# Patient Record
Sex: Male | Born: 1990
Health system: Southern US, Community
[De-identification: ages and names within clinical notes are randomized; demographics above are authoritative.]

## PROBLEM LIST (undated history)

## (undated) DIAGNOSIS — J302 Other seasonal allergic rhinitis: Secondary | ICD-10-CM

## (undated) HISTORY — DX: Other seasonal allergic rhinitis: J30.2

---

## 2009-04-30 ENCOUNTER — Ambulatory Visit: Payer: Self-pay | Admitting: Sports Medicine

## 2009-05-20 ENCOUNTER — Ambulatory Visit: Payer: Self-pay | Admitting: Orthopedic Surgery

## 2009-05-27 ENCOUNTER — Ambulatory Visit: Payer: Self-pay | Admitting: Orthopedic Surgery

## 2009-11-05 ENCOUNTER — Encounter: Payer: Self-pay | Admitting: Family Medicine

## 2010-05-31 ENCOUNTER — Ambulatory Visit: Payer: Self-pay | Admitting: Family Medicine

## 2010-09-04 ENCOUNTER — Ambulatory Visit: Payer: Self-pay | Admitting: Family Medicine

## 2015-11-23 ENCOUNTER — Telehealth (INDEPENDENT_AMBULATORY_CARE_PROVIDER_SITE_OTHER): Payer: Self-pay | Admitting: Internal Medicine

## 2015-11-23 NOTE — Telephone Encounter (Signed)
Called to reschedule appt for 11/25/15 due to the physician not being in the office in the morning

## 2015-11-25 ENCOUNTER — Ambulatory Visit (INDEPENDENT_AMBULATORY_CARE_PROVIDER_SITE_OTHER): Payer: Commercial Managed Care - POS | Admitting: Internal Medicine

## 2015-12-02 ENCOUNTER — Encounter (INDEPENDENT_AMBULATORY_CARE_PROVIDER_SITE_OTHER): Payer: Self-pay | Admitting: Internal Medicine

## 2015-12-02 ENCOUNTER — Ambulatory Visit (FREE_STANDING_LABORATORY_FACILITY): Payer: Commercial Managed Care - POS | Admitting: Internal Medicine

## 2015-12-02 VITALS — BP 135/83 | HR 74 | Temp 98.9°F | Resp 12 | Ht 67.5 in | Wt 208.0 lb

## 2015-12-02 DIAGNOSIS — L299 Pruritus, unspecified: Secondary | ICD-10-CM

## 2015-12-02 DIAGNOSIS — Z833 Family history of diabetes mellitus: Secondary | ICD-10-CM | POA: Insufficient documentation

## 2015-12-02 DIAGNOSIS — E669 Obesity, unspecified: Secondary | ICD-10-CM | POA: Insufficient documentation

## 2015-12-02 DIAGNOSIS — Z0000 Encounter for general adult medical examination without abnormal findings: Secondary | ICD-10-CM

## 2015-12-02 LAB — CBC
Absolute NRBC: 0 10*3/uL
Hematocrit: 44.2 % (ref 42.0–52.0)
Hgb: 14.2 g/dL (ref 13.0–17.0)
MCH: 27 pg — ABNORMAL LOW (ref 28.0–32.0)
MCHC: 32.1 g/dL (ref 32.0–36.0)
MCV: 84.2 fL (ref 80.0–100.0)
MPV: 10 fL (ref 9.4–12.3)
Nucleated RBC: 0 /100 WBC (ref 0.0–1.0)
Platelets: 296 10*3/uL (ref 140–400)
RBC: 5.25 10*6/uL (ref 4.70–6.00)
RDW: 13 % (ref 12–15)
WBC: 6.25 10*3/uL (ref 3.50–10.80)

## 2015-12-02 LAB — URINALYSIS REFLEX TO MICROSCOPIC EXAM - REFLEX TO CULTURE
Bilirubin, UA: NEGATIVE
Glucose, UA: NEGATIVE
Ketones UA: NEGATIVE
Leukocyte Esterase, UA: NEGATIVE
Nitrite, UA: NEGATIVE
Protein, UR: NEGATIVE
Specific Gravity UA: 1.03 (ref 1.001–1.035)
Urine pH: 5.5 (ref 5.0–8.0)
Urobilinogen, UA: 0.2

## 2015-12-02 LAB — GFR: EGFR: >60

## 2015-12-02 LAB — LIPID PANEL
Cholesterol / HDL Ratio: 5.2
Cholesterol: 239 mg/dL — ABNORMAL HIGH (ref 0–199)
HDL: 46 mg/dL (ref 40–9999)
LDL Calculated: 167 mg/dL — ABNORMAL HIGH (ref 0–99)
Triglycerides: 129 mg/dL (ref 34–149)
VLDL Calculated: 26 mg/dL (ref 10–40)

## 2015-12-02 LAB — BASIC METABOLIC PANEL
BUN: 16 mg/dL (ref 9.0–28.0)
CO2: 24 mEq/L (ref 21–30)
Calcium: 9.8 mg/dL (ref 8.5–10.5)
Chloride: 102 mEq/L (ref 100–111)
Creatinine: 1.1 mg/dL (ref 0.5–1.5)
Glucose: 80 mg/dL (ref 70–100)
Potassium: 4.4 mEq/L (ref 3.5–5.1)
Sodium: 137 mEq/L (ref 135–146)

## 2015-12-02 LAB — HEMOLYSIS INDEX: Hemolysis Index: 6 (ref 0–18)

## 2015-12-02 LAB — HEMOGLOBIN A1C
Average Estimated Glucose: 108.3 mg/dL
Hemoglobin A1C: 5.4 % (ref 4.6–5.9)

## 2015-12-02 LAB — HEPATIC FUNCTION PANEL
ALT: 37 U/L (ref 0–55)
AST (SGOT): 75 U/L — ABNORMAL HIGH (ref 5–34)
Albumin/Globulin Ratio: 1.1 (ref 0.9–2.2)
Albumin: 4.2 g/dL (ref 3.5–5.0)
Alkaline Phosphatase: 53 U/L (ref 38–106)
Bilirubin Direct: 0.3 mg/dL (ref 0.0–0.5)
Bilirubin Indirect: 0.5 mg/dL (ref 0.0–1.0)
Bilirubin, Total: 0.8 mg/dL (ref 0.1–1.2)
Globulin: 3.9 g/dL — ABNORMAL HIGH (ref 2.0–3.7)
Protein, Total: 8.1 g/dL (ref 6.0–8.3)

## 2015-12-02 MED ORDER — SELENIUM SULFIDE 2.5 % EX LOTN
TOPICAL_LOTION | CUTANEOUS | 0 refills | Status: AC
Start: 2015-12-02 — End: ?

## 2015-12-02 NOTE — Patient Instructions (Addendum)
If you have not done so already, please sign up for Mychart (instructions should be on the bottom of your visit summary).    See me once yearly for annual preventative visit.  This is different and separate from any problem-related visit.      Get a flu vaccine yearly in the fall.  Increase vegetables, fruits, and fiber in your diet.  Exercise at least 30 minutes 5 days per week.  Always wear your seatbelt in the car.  Wear sunscreen that is broad-spectrum (UVA/UVB) and at least SPF 30.  Avoid heavy drinking of alcohol (> 2 drinks daily or >14 drinks per week).  Goal Blood Pressure <140/90  Schedule dental exam and cleaning every 6 months  Have your vision checked every 1-2 years.  Do you have an advanced directive? If so please provide our office a copy. I recommend that all adults regardless of age have an advanced directive.   Medication refills should generally be obtained as part of a routine office visit and not via phone request or MyChart message. Generally if you find yourself running low on a medication, this is a signal that it is time to schedule an appointment to see me.     The best website for medical information is called UpToDate.  It's free for patients.  [email or web address]    The [facility] website also has good information.  [email or web address].[email or web address]    DIETARY RECOMMENDATIONS    Try to limit simple carbohydrates and sugars - these are the enemy if you are looking to lose weight.  Examples are white bread, white rice, potatoes, regular pasta, soda, chips/crackers, cookies, cakes, candy etc.  These foods cause spikes in blood sugar which can lead to feeling hungrier and can contribute to weight gain. Try to replace these with complex carbohydrates: 100% whole wheat bread/pasta, brown rice, oatmeal, and legumes (beans, lentils, etc). Try to avoid soda, diet soda, fruit juice and other sugary drinks entirely. These have been linked to an increased risk of diabetes. Drink water!     Eat at least 5  servings of vegetables and fruit daily.  As a general rule, the more fiber the better. Fiber helps you feel full so you eat less. If you still feel hungry after a meal, try drinking some water and waiting 20 minutes - you may find the urge to eat more will pass. The hormone signal that tells your brain that your stomach is full takes 20 minutes - so if you continue to eat until you feel full you have overeaten.    Try to limit the amount of saturated fat and trans fat in your diet.  These are in foods like red meat, vegetable oil, butter, dairy products that are not low-fat (such as most cheese, 2% or whole milk, cream, etc), fried foods, and rich sweets like cakes/donuts/etc. There are some fats that are good for you - these are unsaturated fats (especially monounsaturated).  These are found in olive oil, canola oil, nuts, seeds, and avocados.  Try to replace saturated and trans fats with these kinds of foods.    Try to limit the amount of sodium in your diet to less than 2000mg per day.  Most people do not get the majority of their sodium from table salt.  The sodium comes from processed foods.  Anything canned, frozen, pre-packaged, etc usually has a large amount of sodium, so read the labels.  Deli meat is another common source.      Eating   out is the most common source of dietary sodium.  Almost all restaurant meals have extremely high sodium (1000-3000mg plus!), even in most cases the salads.  Minimize eating out, and ask if they have any low sodium options.  But generally, fresh cooking at home is the healthiest.

## 2015-12-02 NOTE — Progress Notes (Signed)
Chief Complaint   Patient presents with   . Annual Exam   . itchy scalp       History of Present Illness    Eric Moss is a 25 y.o. male who I am seeing for the first time who is here for evaluation and treatment of the following concerns: He is here for an annual exam. He reports being up-to-date on his tetanus shot.  He declines a flu shot.  He exercises 3-4 times per week with weight lifting and running.  He does not abuse tobacco or alcohol.  He reports a diet that may be heavy at times and carbohydrates.  He has no chronic medical problems.  He has had several surgeries for anterior cruciate ligament repair and shoulder.  He is fasting for blood work today.  He also notes she has had a somewhat chronic difficulty with an itchy scalp.  He washes his hair to 3 times per week with shampoo and conditioner.  He denies any excessive dandruff, but does have some persistent itching of his scalp.  He denies any other history of significant skin conditions.  He has changed shampoos without improvement.  He has a family history of diabetes    Patient Active Problem List    Diagnosis Date Noted   . Obesity (BMI 30-39.9) 12/02/2015   . Family history of diabetes mellitus in father 12/02/2015     History reviewed. No pertinent past medical history.  Past Surgical History:   Procedure Laterality Date   . ARTHROSCOPIC REPAIR ACL Left 2016   . ARTHROSCOPIC REPAIR ACL Right 2010   . SHOULDER SURGERY Left 2011    labrum       There is no immunization history on file for this patient.  Health Maintenance   Topic Date Due   . INFLUENZA VACCINE  10/02/2015   . DEPRESSION SCREENING  12/01/2016   :  Current Outpatient Prescriptions   Medication Sig Dispense Refill   . selenium sulfide (SELSUN) 2.5 % shampoo Massage ~5 -10 mL into wet scalp; leave on for 2-3 min, then rinse thoroughly. Usually 2 applications each week for 2 weeks 118 mL 0     No current facility-administered medications for this visit.      No Known  Allergies  Social History     Social History   . Marital status: Single     Spouse name: N/A   . Number of children: N/A   . Years of education: N/A     Occupational History   . Not on file.     Social History Main Topics   . Smoking status: Never Smoker   . Smokeless tobacco: Never Used   . Alcohol use Yes      Comment: 2-3 week   . Drug use: No   . Sexual activity: Yes     Partners: Female     Other Topics Concern   . Not on file     Social History Narrative    He lives in [place]. From [place] originally. Single. Works in Consulting civil engineer.     Family History   Problem Relation Age of Onset   . Diabetes Father    . Hypertension Father    . No known problems Mother    . No known problems Brother        Review of Systems  CONST: 10 lb WG this year no fevers/chills or sweats, fatigue, muscle aches  EYES: No blurry vision, double vision, loss of  peripheral vision  EARS: No hearing loss, tinnitus, pain or discharge.   NOSE: No congestion, runny nose or bloody nose  MOUTH: No sore throat, oral lesions, difficulty swallowing  NECK: No swollen glands, stiffness or pain  CV: No chest pain, palpitations, leg swelling  RESP: No shortness of breath, cough, wheeze, asthma  GI: No n/v, diarrhea, constipation, abd pain, heartburn, blood in stool  MALE GU: No dysuria, frequency, hematuria, nocturia, testicular changes, penile discharge, erectile dysfunction  MSK:  No joint or muscle pain, swelling or weakness  SKIN:   No other rashes lumps, sores or concerning moles  ENDO:  No heat/cold intolerance, polyuria, polydipsia, polyphagia  HEME:  No bruising/bleeding, swollen glands  NEURO:  No headache, lightneadedness, dizziness, loss of consciousness, numbness/tingling, weakness  PSYCH:  No depressed mood, anhedonia, anxiety    Physical Exam:  BP 135/83 (BP Site: Left arm, Patient Position: Sitting, Cuff Size: Medium)   Pulse 74   Temp 98.9 F (37.2 C) (Oral)   Resp 12   Ht 1.715 m (5' 7.5")   Wt 94.3 kg (208 lb)   SpO2 99%   BMI 32.10 kg/m     Wt Readings from Last 3 Encounters:   12/02/15 94.3 kg (208 lb)     GEN: muscular male alert and appropriate in no apparent distress  HENT: tympanic membrane normal bilaterally, no nasal congestion, oropharynx normal w no exudate or erythema  EYES: PERRL, EOMI, no pallor or scleral icterus, normal conjunctiva  NECK: supple, no thyromegaly or nodules appreciated  LYMPH: no cervical or supraclavicular lymphadenopathy appreciated  CV: RRR, no m/r/g.  No LE edema.  2+ radial pulses present and equal.  RESP: CTAB, normal effort, no rhonchi or rales  GI: soft, nontender/ nondistended, NABS, no rebound or guarding, no evident hepatosplenomegaly  SKIN: warm, dry.  no visible rash of scalp   MSK: Normal bulk and tone  NEURO: PERRLA, EOMI, face symmetric, hearing intact to voice, palate raise, shoulder shrug and neck flexion intact, tongue midline. Moves all extremities.  PSYCH: normal mood and affect    Assessment/Plan:    1. Annual physical exam  Anticipatory guidance given. Recommended routine blood work as ordered below.     - Basic Metabolic Panel  - Hepatic function panel (LFT)  - Lipid panel  - CBC without differential  - UA/Micro reflex Culture Routine    2. Obesity (BMI 30-39.9)  I counseled him that based on his body mass index (BMI) that he is considered obese. . At this time I counseled him on dietary changes he could make to try to lose weight and provided these in written form in his discharge paperwork. I recommended dietary changes, exercise 30 minutes or more at least 5 days of the week, avoiding sedentary behavior while at home or at work.     HIgh BMI Follow Up   BMI Follow Up Care Plan Documented    Nutrition Counseling:   Low carbohydrate diet education   Weight-reducing diet education    Physical Activity Counseling   Patient advised about exercise    - Hemoglobin A1C    3. Itchy scalp  Trial of selenium - if not improved recommend derm evaluation  - selenium sulfide (SELSUN) 2.5 % shampoo; Massage ~5  -10 mL into wet scalp; leave on for 2-3 min, then rinse thoroughly. Usually 2 applications each week for 2 weeks  Dispense: 118 mL; Refill: 0    4. Family history of diabetes mellitus in father  Check A1C above    Orders Placed This Encounter   Procedures   . Basic Metabolic Panel   . Hepatic function panel (LFT)   . Lipid panel   . CBC without differential   . UA/Micro reflex Culture Routine   . Hemoglobin A1C   . Hemolysis index   . GFR     Risks & benefits of the new medication(s) were explained to the patient, who appeared to understand and agrees to the treatment plan.    Return in about 1 year (around 12/01/2016).    Gloriajean Dell, MD  [facility] [place]  [address]  [place], [place] [address]  [phone]  F: [phone]

## 2015-12-03 ENCOUNTER — Encounter (INDEPENDENT_AMBULATORY_CARE_PROVIDER_SITE_OTHER): Payer: Self-pay | Admitting: Internal Medicine

## 2015-12-03 DIAGNOSIS — R7401 Elevation of levels of liver transaminase levels: Secondary | ICD-10-CM | POA: Insufficient documentation

## 2015-12-03 DIAGNOSIS — E7800 Pure hypercholesterolemia, unspecified: Secondary | ICD-10-CM | POA: Insufficient documentation

## 2015-12-24 ENCOUNTER — Encounter (INDEPENDENT_AMBULATORY_CARE_PROVIDER_SITE_OTHER): Payer: Self-pay | Admitting: Internal Medicine

## 2015-12-28 ENCOUNTER — Other Ambulatory Visit (INDEPENDENT_AMBULATORY_CARE_PROVIDER_SITE_OTHER): Payer: Self-pay | Admitting: Internal Medicine

## 2015-12-28 DIAGNOSIS — L299 Pruritus, unspecified: Secondary | ICD-10-CM

## 2019-01-15 ENCOUNTER — Encounter (INDEPENDENT_AMBULATORY_CARE_PROVIDER_SITE_OTHER): Payer: Self-pay | Admitting: Nurse Practitioner

## 2019-01-15 ENCOUNTER — Ambulatory Visit (INDEPENDENT_AMBULATORY_CARE_PROVIDER_SITE_OTHER): Payer: Commercial Managed Care - POS

## 2019-01-15 ENCOUNTER — Ambulatory Visit (INDEPENDENT_AMBULATORY_CARE_PROVIDER_SITE_OTHER): Payer: Commercial Managed Care - POS | Admitting: Nurse Practitioner

## 2019-01-15 VITALS — BP 142/94 | HR 88 | Temp 99.2°F | Ht 68.0 in | Wt 215.0 lb

## 2019-01-15 DIAGNOSIS — R6889 Other general symptoms and signs: Secondary | ICD-10-CM

## 2019-01-15 DIAGNOSIS — R509 Fever, unspecified: Secondary | ICD-10-CM

## 2019-01-15 DIAGNOSIS — R0600 Dyspnea, unspecified: Secondary | ICD-10-CM

## 2019-01-15 LAB — POCT INFLUENZA A/B
POCT Rapid Influenza A AG: NEGATIVE
POCT Rapid Influenza B AG: NEGATIVE

## 2019-01-15 MED ORDER — ALBUTEROL SULFATE HFA 108 (90 BASE) MCG/ACT IN AERS
2.0000 | INHALATION_SPRAY | RESPIRATORY_TRACT | 0 refills | Status: AC | PRN
Start: 2019-01-15 — End: 2019-03-16

## 2019-01-15 MED ORDER — AZITHROMYCIN 250 MG PO TABS
ORAL_TABLET | ORAL | 0 refills | Status: AC
Start: 2019-01-15 — End: 2019-01-20

## 2019-01-15 NOTE — Patient Instructions (Signed)
COVID-19 Discharge Instructions     Thank you for choosing [facility].  During your visit, your healthcare team was concerned that you may be infected with the virus which causes COVID-19.  You have been tested for this virus, and may already have your test results back.    What is COVID-19?  COVID-19 is the disease caused by a coronavirus called SARS-CoV-2. The virus is very contagious, and can be spread through coughing, sneezing, or even talking.  The virus can also be spread by close personal contact or through contaminated surfaces.  It is believed that the COVID-19 virus can remain on objects and surfaces for several days, but the amount of time it remains infectious is not fully known.  Because of how the virus enters the body, it is important to limit touching your face, mouth, and nose.  It is also important to wash your hands or use hand sanitizer frequently, and to clean high touch surfaces thoroughly.  Social distancing and isolation are key ways to decrease the spread of the virus.    How will I receive my test results?   If you have been tested for COVID-19 during your hospitalization and the result is still pending, you will be receiving a phone call from our COVID-19 Results Notification Team when the final result is ready.  Please make sure that you provided the correct phone number to registration so that they can reach you.  You should also sign up for My Chart so that you can also view the result online when it is available.  Please be patient, since your COVID-19 test results may take several days to be processed.  In the event that you would like to speak with a team member, you can contact the COVID-19 Hospital Results Call Center at [phone] on Monday-Friday from 8:30am-5:00pm.    Home Care -- Isolation:  ? Stay at home except to get medical care. You should restrict all activities outside your home. Do not go to work, school, or public areas. Avoid using public  transportation, ride-sharing, or taxis.  ? Isolate yourself from other people within your home as much as possible.  Anyone sick should separate themselves from others at home by staying in a specific “sick” bedroom or space and using a different bathroom (if possible).  ? Elderly family members, persons with chronic illnesses such as diabetes, or those taking medications that affect their immune system are at particularly high risk.  For anyone in your home who has increased risk, please notify their primary care physician that you may be infected.  ? You should restrict contact with pets and other animals while you are sick with COVID-19.  It is not fully known whether pets or other animals can become sick with COVID-19 or spread the virus.  It is recommended that people sick with COVID-19 limit contact with animals until more information is known about the virus.  ? Wear a cloth facemask when you must go out in public. You can refer to https://www.cdc.gov/coronavirus/2019-ncov/prevent-getting-sick/diy-cloth-face-coverings.html  for instructions on how to make your own.  ? Call ahead before seeing your doctor, any other medical provider, or seeking medical care at any healthcare facility.  You should tell them that you have been diagnosed with or are being tested for COVID-19. IF YOU CALL 911, TELL THE DISPATCHER THAT YOU HAVE BEEN DIAGNOSED WITH OR ARE BEING TESTED FOR COVID-19.    Home Care -- Cleaning:  ? Avoid touching your eyes, nose, mouth,   or other parts of your face.  ? Wash your hands often, especially after blowing your nose, coughing, or sneezing; after going to the bathroom; and before eating or preparing food.  -      Use soap and water for at least 20 seconds.  Using soap and water is the best option for hand washing if the hands are visibly dirty.  -      If you cant use soap and water, use an alcohol-based hand sanitizer with at least 60% alcohol, covering all surfaces of your hands and rubbing  them together until they feel dry.  ? Avoid sharing personal household items. You should not share dishes, drinking glasses, cups, eating utensils, towels, or bedding with other people or pets in your home. After using these items, they should be washed thoroughly with soap and water.  ? Clean all "high touch" surfaces every day with a disinfecting spray or wipes according to the directions on the label.  - High touch surfaces include counters, tabletops, doorknobs, bathroom fixtures, toilets, phones, keyboards, tablets, and bedside tables. Please pay special attention to cleaning your cell phone throughout the day!  -     Clean any surfaces that may have blood, stool, or other body fluids on them.    Home Care  Symptom Management:  ? Please rest and avoid strenuous activity.  ? Fevers can be treated with acetaminophen (Tylenol). You should not exceed the total recommended dose on the medication label.  Avoid NSAIDs like ibuprofen (Motrin, Advil) and naproxen (Naprosyn, Aleve).  ? Cough medication may have been recommended by your healthcare team.  You should contact a medical provider before using before starting any other over-the-counter cough or cold medications.      What to Look Out For?    Most people with COVID-19 infection will get better, and many will have only mild symptoms. However, a significant number will develop severe infection and symptoms which require close monitoring and treatment in the hospital.  Please contact your doctor or return to the Emergency Department if you develop the warning signs of more severe infection such as:   ? A high fever not controlled by acetaminophen (Tylenol)  ? Worsening shortness of breath that limits things that you could normally do   ? Severe weakness and fatigue  ? Worsening chest pain   ? Worsening cough  ? Blue, white, or gray colored lips  ? New confusion or sleepiness  ? Stroke-like symptoms which may include: Sudden numbness or weakness in the face, arm or  leg, confusion, trouble seeing and/or walking, loss of balance, or sudden severe headache  ? New onset of blue, white, or gray skin discoloration and coldness to extremities (arms, hands, legs and feet)  ? This list is not all inclusive; please consult your medical provider for any other severe symptoms that are concerning to you.      Discontinuing Home Isolation    Patients with confirmed COVID-19 should remain under home isolation precautions until the risk of giving it to others is thought to be very low. In order to take yourself off home isolation, the CDC currently recommends that you meet all of these criteria:    ? You have had no fever for at least 72 hours (that is three full days of no fever without Acetaminophen)  AND  ? other symptoms have improved (for example, when your cough or shortness of breath have improved)  AND  ? at least 10 days have  passed since your symptoms first appeared    There may be additional state or local isolation recommendations or restrictions in place that you should continue to follow.    If you are not sure about whether you can discontinue your home isolation, please call your primary care doctor for advice.    For further information please visit the Center for Disease Control (CDC) website:  o What to do if Sick @    DiscoHelp.si.html  o Share the Facts @   http://hicks.info/  o Overview and Impact @         http://espinoza.biz/.pdf       Treating Pneumonia  Pneumonia is an infection of one or both of the lungs. Pneumonia:   Is often caused by either a virus, fungus, or bacteria   Can be very serious, especially in babies, young children, and older adults. Its also serious for those with other long-term health problems or weakened immune systems.   Is sometimes treated at home and sometimes in the hospital     Antibiotic medicines  Antibiotics may be prescribed for pneumonia caused by bacteria. They may be pills (oral medicines) or shots (injections). Or they may be given by IV (intravenously) into a vein. If you are taking pills at home:   Fill your prescription and start taking your medicine as soon as you can.   You will likely start to feel better in a day or 2, but dont stop taking the antibiotic.   Use a pill organizerto help you remember to take your medicine.   Let yourhealthcare providerknow if you have side effects.   Take your medicine exactly as directed on the label. Talk with your provider or pharmacist if you have any questions.  Antiviral medicines  Antiviral medicine may be prescribed for pneumonia caused by a virus. For example, antiviral medicine may be prescribed for pneumonia caused by the flu virus. Antibiotics don't work against viruses. If you are taking antiviral medicine at home:   Fill your prescription and start taking your medicine as soon as you can.   Talk with your provider or pharmacist about possible side effects.   Take the medicine exactly as instructed.  To relieve symptoms  There are many medicines that can help relieve symptoms of pneumonia. Some are prescription and some are over the counter.  Your healthcare provider may advise:   Acetaminophen or ibuprofen to lower your fever and to reduce headache or other pain   Cough medicine to loosen mucus or to reduce coughing  Check with your healthcare provider or pharmacist before taking any over-the-counter medicines. Some over-the-counter medicines should not be used if you have certain health conditions.  Special treatments  If you are hospitalized for pneumonia, you may have other therapies, including:   Inhaled medicines to help with breathing or chest congestion   Supplemental oxygen to increase low oxygen levels  Drink fluids and eat healthy  You should eat healthy to help your body fight the infection. Drinking a  lot of fluids helps to replace fluids lost from fever and to loosen mucus in your chest.   Diet. Make healthy food choices, including fruits and vegetables, lean meats and other proteins, 100% whole grain, and low-fat or no-fat dairy products.   Fluids. Drink at least 6 to 8 tall glasses a day. Water and 100% fruit or vegetablejuice are best.  Get plenty of rest and sleep  You may be more tired than normal for a while. It's important  to get enough sleep at night. Its also important to rest during the day. Talk with your healthcare provider if coughing or other symptoms are interfering with your sleep.  Preventing the spread of germs  The best thing you can do to prevent spreading germs is to wash your hands often. You should:   Scrub your hands with soap and warm water for 15 to 20 seconds.   Clean in between your fingers, the backs of your hands, and around your nails.   Dry your hands on a separate towel or use paper towels.  You should also:   Keep alcohol-based hand cleaners nearby.   Make sure you also clean surfaces that you touch. Use a product that kills all types of germs.   Stay away from others until you are feeling better.  When to call your healthcare provider  Call yourhealthcare providerif Eric Moss any of these:   Symptoms get worse or new symptoms develop   Fever continues   Shortness of breath with normal daily activities   Side effects from your medicine   Increased mucus or mucus that is darker in color   Coughing gets worse   Chest pain when coughing or breathing  Call 911  Call 911if any of these occur:   Lipsor fingersare bluish, purple, or gray in color   Trouble breathing or wheezing   Shortness of breath that gets worse and doesn't improve with treatment   Feeling faint or dizzy   Loss of consciousness   Trouble talking or swallowing   Feeling of doom  StayWell last reviewed this educational content on 12/31/2017   2000-2020 [employer], [place]. [address] [place], [place] [address]. All rights reserved. This information is not intended as a substitute for professional medical care. Always follow your healthcare professional's instructions.       Azithromycin 250 mg take 2 tablets today, then one tablet for next four days   Albuterol inhaler 2 puffs into lungs every 4 hours as needed for shortness of breath and/or wheezing   Follow up with primary care provider or return to clinic if no improvement in 3 days   Seek urgent/emergency care if symptoms worsening - worsening shortness of breath, chest pain, etc.

## 2019-01-15 NOTE — Progress Notes (Addendum)
[facility]  PROGRESS NOTE     Patient: Eric Moss   Date of Service: 01/15/2019   MRN: [id number]       Eric Moss is a 28 y.o. male      HISTORY     Chief Complaint   Patient presents with    Flu like symptoms     fever 101 today, shortness of breath with talking started today, sore throat and fatigue x 1 weeks        HPI  28 year old male, with PMH of seasonal allergies, presents with ongoing fevers, fatigue, malaise, and mild sore throat x 1 week. Dyspnea with talking developed today. He was on self quarantine in preparation of going home for Christmas and developed symptoms one week ago on day 4 of his self imposed quarantine. Initially, he had fevers of over 101 every day and was taking ibuprofen. Fever came down yesterday for the first time below 101 but returned to 101.3 today. He also developed new onset shortness of breath when talking. He denies any CP, taste or smell changes, body aches, h/a, N/V, diarrhea. Did have some nasal congestion and was taking mucinex for relief. States he did not receive the influenza vaccine this season.   Review of Systems   Constitutional: Positive for fatigue and fever. Negative for chills and diaphoresis.        Malaise   HENT: Positive for congestion, rhinorrhea and sore throat. Negative for ear pain, sinus pressure, sinus pain and trouble swallowing.    Eyes: Negative.    Respiratory: Positive for cough and shortness of breath. Negative for chest tightness and wheezing.    Cardiovascular: Negative.    Gastrointestinal: Negative for abdominal pain, diarrhea, nausea and vomiting.   Musculoskeletal: Negative for back pain, myalgias, neck pain and neck stiffness.   Skin: Negative.    Allergic/Immunologic: Positive for environmental allergies. Negative for immunocompromised state.   Neurological: Negative for dizziness, weakness and headaches.   Psychiatric/Behavioral: The patient is not nervous/anxious.        History:  Past Medical History:   Diagnosis Date     Seasonal allergic rhinitis        Past Surgical History:   Procedure Laterality Date    ARTHROSCOPIC REPAIR ACL Left 2016    ARTHROSCOPIC REPAIR ACL Right 2010    KNEE SURGERY Bilateral     ACL    SHOULDER SURGERY Left 2011    labrum    SHOULDER SURGERY Left        Family History   Problem Relation Age of Onset    Diabetes Father     Hypertension Father     No known problems Mother     No known problems Brother        Social History     Tobacco Use    Smoking status: Never Smoker    Smokeless tobacco: Never Used   Substance Use Topics    Alcohol use: Yes     Comment: 2-3 week    Drug use: No       History reviewed.        Current Outpatient Medications:     albuterol sulfate HFA (Proventil HFA) 108 (90 Base) MCG/ACT inhaler, Inhale 2 puffs into the lungs every 4 (four) hours as needed for Wheezing or Shortness of Breath, Disp: 1 Inhaler, Rfl: 0    azithromycin (ZITHROMAX) 250 MG tablet, Take 2 tabs daily on day 1, then 1 tablet daily for  4 days., Disp: 6 tablet, Rfl: 0    selenium sulfide (SELSUN) 2.5 % shampoo, Massage ~5 -10 mL into wet scalp; leave on for 2-3 min, then rinse thoroughly. Usually 2 applications each week for 2 weeks, Disp: 118 mL, Rfl: 0    No Known Allergies    Medications and Allergies reviewed.    PHYSICAL EXAM     Vitals:    01/15/19 1918 01/15/19 1927   BP:  (!) 142/94   Pulse:  88   Temp:  99.2 F (37.3 C)   TempSrc:  Tympanic   SpO2: 96% 99%   Weight: 97.5 kg (215 lb)    Height: 1.727 m (5\' 8" )        Physical Exam   Constitutional: He is oriented to person, place, and time. He appears well-developed and well-nourished. No distress.   HENT:   Head: Normocephalic and atraumatic.   Mouth/Throat: Uvula is midline. Posterior oropharyngeal erythema present. No oropharyngeal exudate or posterior oropharyngeal edema.   Mild post oropharyngeal erythema   Eyes: Conjunctivae are normal. Pupils are equal, round, and reactive to light. Right eye exhibits no discharge. Left eye  exhibits no discharge.   Neck: Normal range of motion.   Cardiovascular: Normal rate, regular rhythm and normal heart sounds. Exam reveals no gallop and no friction rub.   No murmur heard.  Pulmonary/Chest: Effort normal. No respiratory distress. He has decreased breath sounds in the right upper field and the left upper field. He has no wheezes. He has no rales.   Lymphadenopathy:     He has no cervical adenopathy.   Neurological: He is alert and oriented to person, place, and time.   Skin: Skin is warm and dry. He is not diaphoretic.   Psychiatric: He has a normal mood and affect.         [facility] COURSE       Results     Procedure Component Value Units Date/Time    COVID-19 SYMPTOMATIC Patient [[id number]] Collected: 01/15/19 1922    Specimen: Nasopharyngeal Swab from Nasopharynx Updated: 01/15/19 2323     Purpose of COVID testing Diagnostic -PUI     SARS-CoV-2 Specimen Source Nasal Swab    Influenza A/B [[id number]]  (Normal) Collected: 01/15/19 1957     Updated: 01/15/19 1957     POCT QC Pass     POCT Rapid Influenza A AG Negative     POCT Rapid Influenza B AG Negative            X-ray Chest Pa And Lateral    Result Date: 01/15/2019  Frontal and lateral chest x-ray Indication: Shortness of breath Comparison: None Findings: Cardio mediastinal silhouette is within normal limits. Lungs are clear. No pneumonia, vascular congestion, or pleural effusion. Bones are unremarkable.      No acute cardiopulmonary findings. Lorinda Creed, MD  01/15/2019 7:53 PM        Orders Placed This Encounter   Medications    azithromycin (ZITHROMAX) 250 MG tablet     Sig: Take 2 tabs daily on day 1, then 1 tablet daily for 4 days.     Dispense:  6 tablet     Refill:  0    albuterol sulfate HFA (Proventil HFA) 108 (90 Base) MCG/ACT inhaler     Sig: Inhale 2 puffs into the lungs every 4 (four) hours as needed for Wheezing or Shortness of Breath     Dispense:  1 Inhaler     Refill:  0  PROCEDURES     Procedures     CXR - no infiltrates  noted  ASSESSMENT   Differentials: pneumonia viral vs bacterial, URI, Covid, influenza. CXR negative but diminished breath sounds noted to bilateral upper lobes. Symptoms x1 week and worsening. Discussed with patient and will treat for pneumonia based on assessment findings.  Encounter Diagnoses   Name Primary?    Fever, unspecified Yes    Flu-like symptoms     Dyspnea, unspecified type           SSESSMENT    PLAN      Azithromycin 250 mg - two tablets day 1, then one tablet daily for 4 days   Albuterol inhaler 2 puffs into lungs every 4 hours as needed for shortness of breath. Discussed method of using inhaler and potential AEs including increased heart rate.   Covid-19 results pending. Discussed process and timeline for receiving results. Patient made aware phone notifications are only for positive results. Will have to f/u in MyChart or result line for negative results. Instructions for MyChart registration given to patient.    Continue to self quarantine, except for medical appointments, while awaiting results and symptomatic. Covid work Physicist, medical given to him.    Tylenol for fever and/or body aches   Rest and plenty of fluids  Salt water gargles, sore throat lozenges such as cepacol, honey tea for symptoms of sore throat.  Follow up with PCP or RTC if no improvement in symptoms or worsening  Reviewed alarm symptoms that would require urgent/emergent evaluation including worsening dyspnea, CP, inability to keep fluids down, weakness, etc. Written information also given to patient.      Discussed results and diagnosis with patient/family.  Reviewed warning signs for worsening condition, as well as, indications for follow-up with pmd and return to urgent care clinic.   Patient/family expressed understanding of instructions.    Orders Placed This Encounter   Procedures    COVID-19 SYMPTOMATIC Patient    X-ray chest PA and lateral    Influenza A/B    Influenza A/B         An After Visit Summary was printed  and given to the patient.      Signed,  Vito Berger, NP

## 2019-01-19 LAB — CORONAVIRUS 2 (SARS-COV-2) RNA DETECTION: SARS CoV 2 Overall Result: DETECTED — AB

## 2019-01-21 ENCOUNTER — Telehealth: Payer: Self-pay

## 2019-01-21 NOTE — Telephone Encounter (Signed)
COVID-19 Test Results Notification Team    RN placed call to patient in an effort to provide COVID-19 POSITIVE test result.  Attempted to reach patient at the following numbers:[phone].     Left the following voicemail for patient:    “My name is Eric and I am a RN at [facility] calling to provide you with results from your recent test done at Urgent Care.  Please call me back as soon as possible at [phone] number so that I can provide you with the results of your test.  I can be reached Monday-Friday 8:30 am-5:00 pm Thank you.”    RN will continue efforts to contact patient.      Eric Moss BSN RN ACM-RN  COVID-19 Notification Team  COVID-19 Test Results Call Center:  [phone]  Direct Line: [phone]

## 2019-01-21 NOTE — Telephone Encounter (Signed)
COVID-19 Test Results Notification Team    RN placed call to patient in an effort to provide COVID-19 POSITIVE test result.  Attempted to reach patient at the following numbers:[phone].     Left the following voicemail for patient:    My name is Ja'Quinta and I am a Charity fundraiser at [facility] calling to provide you with results from your recent test done at Urgent Care.  Please call me back as soon as possible at [phone] number so that I can provide you with the results of your test.  I can be reached Monday-Friday 8:30 am-5:00 pm Thank you.    RN will continue efforts to contact patient.      Lorraine Lax BSN RN ACM-RN  COVID-19 Notification Team  COVID-19 Test Results Call Center:  [phone]  Direct Line: [phone]

## 2019-01-22 ENCOUNTER — Telehealth (INDEPENDENT_AMBULATORY_CARE_PROVIDER_SITE_OTHER): Payer: Self-pay

## 2019-01-22 NOTE — Telephone Encounter (Signed)
COVID-19 Test Results Notification Team    RN spoke with patient to provide POSITIVE COVID-19 test result.  Patient states he is aware of his positive test results and has reviewed all education paperwork for CDC Guidelines.    Patient was advised to continue to follow the CDC Guidelines for 10 things you can do to manage your COVID-19 symptoms at home.  Reviewed the following CDC Guidelines with the patient:     Stay home from work, school and public places.   Get rest and stay hydrated.   If you have an appointment, call the healthcare provider ahead of time and notify them that you have COVID-19.   For medical emergencies, call 911 and notify dispatch that you have COVID-19.   Coverage cough and sneezes   Wash your hands often--20 seconds or with sanitizer that contains at least 60% alcohol.   Stay in a specific room and away from other people in your home.  Wear a mask if you are around people.   Avoid sharing personal items (dishes, towels, bedding).   Clean all surfaces that are touched often.    Monitor your symptoms and contact primary care   o Monitor emergency warning signs--persistent fever, trouble breathing, persistent pain or pressure in the chest, new confusion or inability to arouse, bluish lips or face, etc.   o Call 911 if having an emergency   Do not discontinue home isolation until directed by primary care provider or health department  o General guidance:    - If symptoms are present:   Self-isolate until at least 1 day (24 hours) has passed since recovery defined as resolution of fever without the use of fever-reducing medications and improvement in COVID-related symptoms (e.g., cough, shortness of breath, or other symptoms); and, at least 10 days have passed since symptoms first appeared OR  - If no symptoms are present:   Self-isolate until at least 10 days have passed since the date of your first positive COVID-19 test and have had no subsequent illness    Patient has  insurance, but no PCP.  Patient is not interested in making an appointment at this time.     Answered all patient questions.     Advised patient to contact their Urgent Care in the event that patient's symptoms change or worsen.      Reminded patient that the emergency room is always available in the event of an emergency.      Stann Ore, RN  COVID-19 Notification Team  Direct Line: [phone]; [phone]  COVID-19 Test Results Call Center: [phone]

## 2019-02-08 ENCOUNTER — Encounter (INDEPENDENT_AMBULATORY_CARE_PROVIDER_SITE_OTHER): Payer: Self-pay

## 2019-03-11 ENCOUNTER — Encounter (INDEPENDENT_AMBULATORY_CARE_PROVIDER_SITE_OTHER): Payer: Self-pay

## 2019-04-08 ENCOUNTER — Encounter (INDEPENDENT_AMBULATORY_CARE_PROVIDER_SITE_OTHER): Payer: Self-pay

## 2019-05-09 ENCOUNTER — Encounter (INDEPENDENT_AMBULATORY_CARE_PROVIDER_SITE_OTHER): Payer: Self-pay

## 2019-06-08 ENCOUNTER — Encounter (INDEPENDENT_AMBULATORY_CARE_PROVIDER_SITE_OTHER): Payer: Self-pay

## 2019-07-09 ENCOUNTER — Encounter (INDEPENDENT_AMBULATORY_CARE_PROVIDER_SITE_OTHER): Payer: Self-pay

## 2019-08-08 ENCOUNTER — Encounter (INDEPENDENT_AMBULATORY_CARE_PROVIDER_SITE_OTHER): Payer: Self-pay

## 2019-09-08 ENCOUNTER — Encounter (INDEPENDENT_AMBULATORY_CARE_PROVIDER_SITE_OTHER): Payer: Self-pay

## 2020-12-18 ENCOUNTER — Other Ambulatory Visit (HOSPITAL_BASED_OUTPATIENT_CLINIC_OR_DEPARTMENT_OTHER): Payer: Self-pay

## 2020-12-18 ENCOUNTER — Ambulatory Visit: Payer: Self-pay | Attending: Internal Medicine

## 2020-12-18 DIAGNOSIS — Z23 Encounter for immunization: Secondary | ICD-10-CM

## 2020-12-18 MED ORDER — PFIZER COVID-19 VAC BIVALENT 30 MCG/0.3ML IM SUSP
INTRAMUSCULAR | 0 refills | Status: AC
  Filled 2020-12-18: qty 0.3, 1d supply, fill #0

## 2020-12-18 NOTE — Progress Notes (Signed)
   Covid-19 Vaccination Clinic  Name:  Glen Stevens    MRN: [id number] DOB: 1990-09-27  12/18/2020  Mr. Glen Stevens was observed post Covid-19 immunization for 15 minutes without incident. He was provided with Vaccine Information Sheet and instruction to access the V-Safe system.   Mr. Glen Stevens was instructed to call 911 with any severe reactions post vaccine: Difficulty breathing  Swelling of face and throat  A fast heartbeat  A bad rash all over body  Dizziness and weakness   Immunizations Administered     Name Date Dose VIS Date Route   Pfizer Covid-19 Vaccine Bivalent Booster 12/18/2020  1:05 PM 0.3 mL 09/30/2020 Intramuscular   Manufacturer: [employer], [employer]   Lot: [id number]   NDC: [phone]

## 2021-06-30 ENCOUNTER — Other Ambulatory Visit (HOSPITAL_COMMUNITY): Payer: Self-pay | Admitting: Family Medicine

## 2021-06-30 DIAGNOSIS — R079 Chest pain, unspecified: Secondary | ICD-10-CM

## 2021-07-20 ENCOUNTER — Telehealth (HOSPITAL_COMMUNITY): Payer: Self-pay | Admitting: *Deleted

## 2021-07-20 MED ORDER — METOPROLOL TARTRATE 50 MG PO TABS: ORAL_TABLET | ORAL | 0 refills | Status: AC

## 2021-07-20 NOTE — Telephone Encounter (Signed)
Reaching out to patient to offer assistance regarding upcoming cardiac imaging study; pt verbalizes understanding of appt date/time, parking situation and where to check in, pre-test NPO status and medications ordered, and verified current allergies; name and call back number provided for further questions should they arise  Larey Brick RN Navigator Cardiac Imaging [facility] [phone] office [phone] cell  Confirmed pharmacy with patient and he will take 50mg  metoprolol tartrate two hours prior to his cardiac CT scan. He is aware to arrive at 11:30am.

## 2021-07-21 ENCOUNTER — Ambulatory Visit (HOSPITAL_COMMUNITY)
Admission: RE | Admit: 2021-07-21 | Discharge: 2021-07-21 | Disposition: A | Discharge status: Home / Self Care | Payer: BC Managed Care – PPO | Source: Ambulatory Visit | Attending: Family Medicine | Admitting: Family Medicine

## 2021-07-21 DIAGNOSIS — I2510 Atherosclerotic heart disease of native coronary artery without angina pectoris: Secondary | ICD-10-CM | POA: Diagnosis not present

## 2021-07-21 DIAGNOSIS — R079 Chest pain, unspecified: Secondary | ICD-10-CM | POA: Diagnosis present

## 2021-07-21 MED ORDER — NITROGLYCERIN 0.4 MG SL SUBL
0.8000 mg | SUBLINGUAL_TABLET | Freq: Once | SUBLINGUAL | Status: AC
  Administered 2021-07-21: 0.8 mg via SUBLINGUAL

## 2021-07-21 MED ORDER — NITROGLYCERIN 0.4 MG SL SUBL
SUBLINGUAL_TABLET | SUBLINGUAL | Status: AC
  Filled 2021-07-21: qty 2

## 2021-07-21 MED ORDER — IOHEXOL 350 MG/ML SOLN
100.0000 mL | Freq: Once | INTRAVENOUS | Status: AC | PRN
  Administered 2021-07-21: 100 mL via INTRAVENOUS

## 2021-07-21 MED ORDER — SODIUM CHLORIDE 0.9 % IV BOLUS
250.0000 mL | Freq: Once | INTRAVENOUS | Status: AC
  Administered 2021-07-21: 250 mL via INTRAVENOUS

## 2021-07-21 NOTE — Progress Notes (Signed)
SBP 73 post nitro for CT heart while sitting on edge of table, felt well initially on sitting but after BP felt quite light headed. Laid back down on table and BP rechecked. Feels much better and SBP 102. 250cc of NS bolus started and once completed BP rechecked. Feels fine sitting on edge of table and therefore wheeled back to [employer]. Feels fine and VS stable. Given a diet coke to drink. BP recheck stable and he feels fine. IV d/c and discharged walking. Feels fine walking.

## 2023-04-22 IMAGING — CT CT HEART MORP W/ CTA COR W/ SCORE W/ CA W/CM &/OR W/O CM
4 of 7 series · 8 of 20 positions shown, 9 images · IV contrast (omnipaque)
Comparison: None Available.
COMPARISON: None Available.

Addendum:
EXAM:
OVER-READ INTERPRETATION  CT CHEST

The following report is a limited chest CT over-read performed by
radiologist Dr. Nikunj Rosario [REDACTED] on 07/21/2021.
This over-read does not include interpretation of cardiac or
coronary anatomy or pathology. The coronary CTA interpretation by
the cardiologist is attached.
HISTORY: Chest pain, unspecified
Cardiac/Coronary  CT
TECHNIQUE: The patient was scanned on a Siemens Force scanner.
PROTOCOL: A 120 kV prospective scan was triggered in the descending thoracic
aorta at 111 HU's. Axial non-contrast 3 mm slices were carried out
through the heart. The data set was analyzed on a dedicated work
station and scored using the Agatston method. Gantry rotation speed
was 250 msecs and collimation was .6 mm. Beta blockade and 0.8 mg of
sl NTG was given. The 3D data set was reconstructed in 5% intervals
of the 35-75 % of the R-R cycle. Systolic and diastolic phases were
analyzed on a dedicated work station using MPR, MIP and VRT modes.
The patient received contrast: 100mL OMNIPAQUE IOHEXOL 350 MG/ML
SOLN.

[Series 6: ts diast sharp · axial · 0.39mm/px · z∈[+1284,+1318]mm · 2 of 256 slices shown]
[im 86/256  lung]
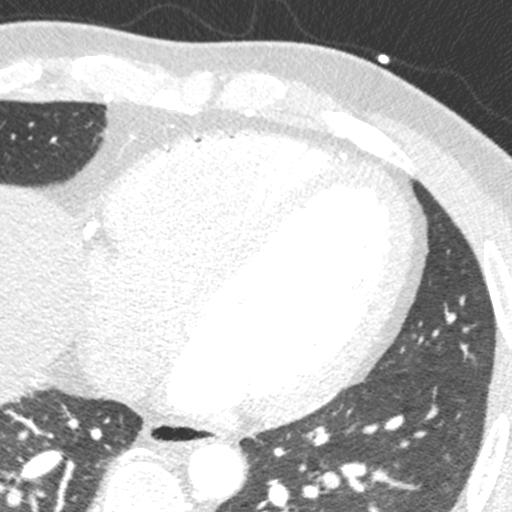
[im 171/256  lung]
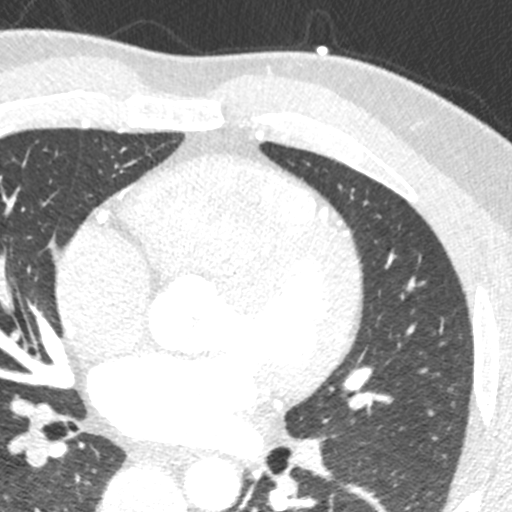

[Series 7: ts syst sharp · axial · 0.39mm/px · z∈[+1284,+1318]mm · 2 of 256 slices shown]
[im 86/256  lung]
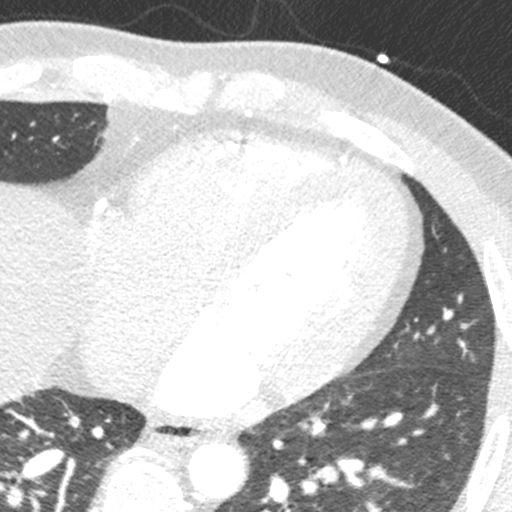
[im 171/256  lung]
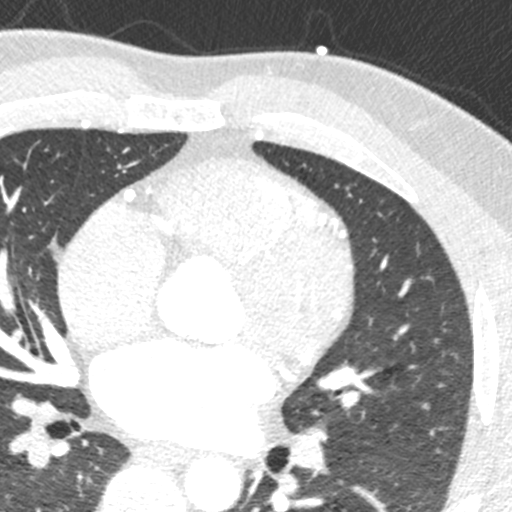

[Series 8: best syst · axial · 0.39mm/px · z∈[+1284,+1318]mm · 2 of 256 slices shown, 3 images]
[im 86/256  vessel]
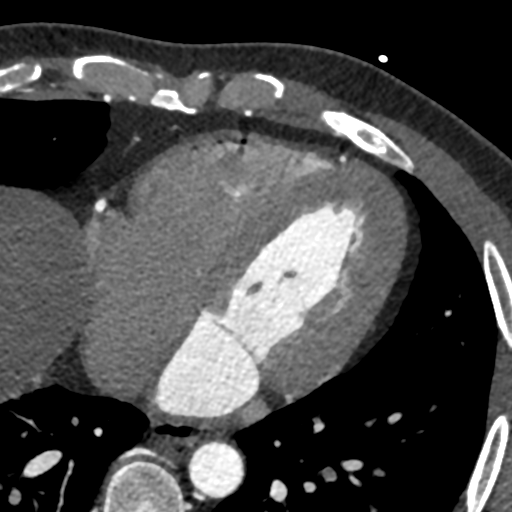
[im 86/256  lung]
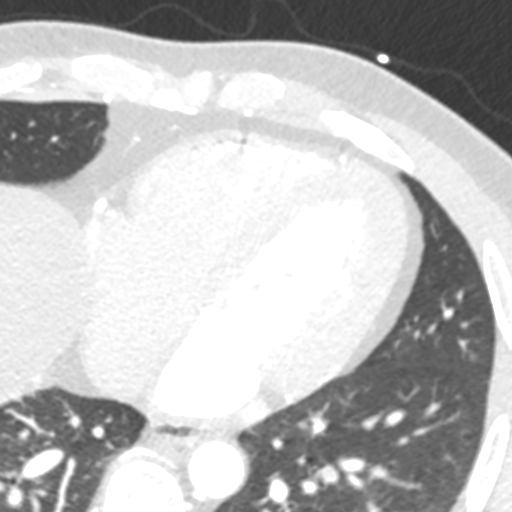
[im 171/256  vessel]
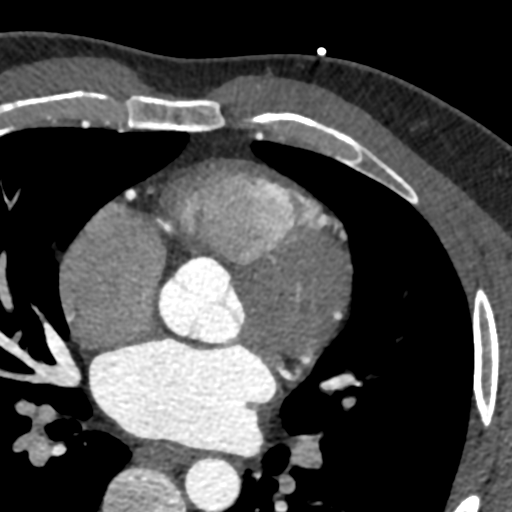

[Series 9: best diast · axial · 0.39mm/px · z∈[+1284,+1318]mm · 2 of 256 slices shown]
[im 86/256  vessel]
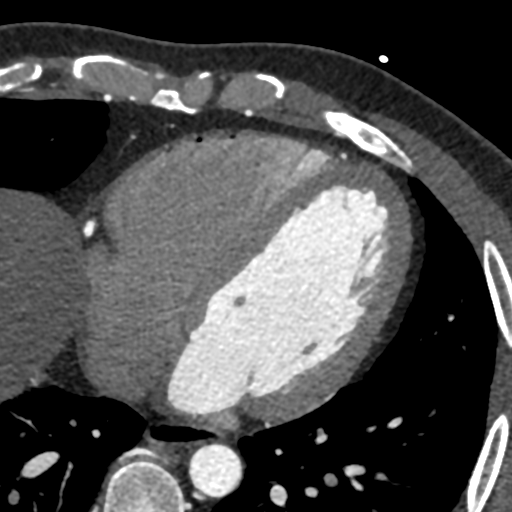
[im 171/256  vessel]
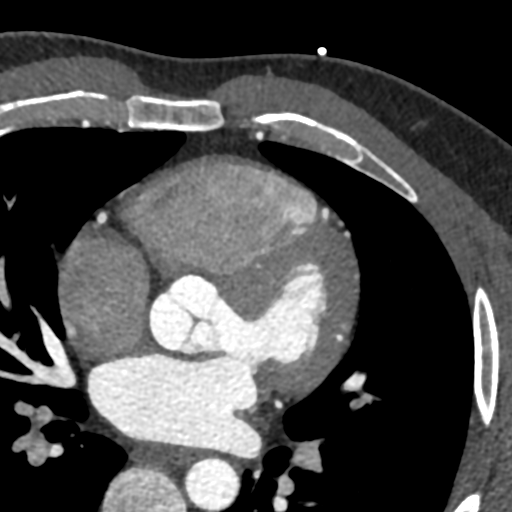

[8 of 20 positions shown; findings below may reference images not displayed]

FINDINGS: Vascular: No pericardial effusion.  Aorta normal caliber.

Mediastinum/Nodes: Esophagus unremarkable.  No observed adenopathy.

Lungs/Pleura: Lungs clear without infiltrate, pleural effusion, or
pneumothorax

Upper Abdomen: Unremarkable

Musculoskeletal: Unremarkable
IMPRESSION: No significant extracardiac findings.
FINDINGS: Image quality: Adequate

Noise artifact is: Moderate motion artifact affects interpretation
of RCA.

Coronary calcium score is 0.

Coronary arteries: Normal coronary origins.  Right dominance.

Right Coronary Artery: Minimal atherosclerotic plaque in the
proximal RCA, <25% stenosis.

Left Main Coronary Artery: No detectable plaque or stenosis.

Left Anterior Descending Coronary Artery: No detectable plaque or
stenosis in LAD. Possible mild atherosclerotic plaque in ostial D1,
small caliber vessel, 25-49% stenosis.

Ramus intermedius: No detectable plaque or stenosis.

Left Circumflex Artery: Mild atherosclerotic plaque in the proximal
LCx, 25-49%. Diffuse disease in small caliber distal OM 1, vessel is
patent.

Aorta: Normal size, 31 mm at the mid ascending aorta (level of the
PA bifurcation) measured double oblique. No calcifications. No
dissection.

Aortic Valve: No calcifications. AV calcium score 0

Other findings:

Normal pulmonary vein drainage into the left atrium.

Normal left atrial appendage without thrombus.

Normal size of the pulmonary artery.
IMPRESSION: 1. Mild CAD in proximal left circumflex artery, 25-49% stenosis,
CADRADS 2.

2. Coronary calcium score of 0.

3. Normal coronary origins with right dominance.

RECOMMENDATIONS:
CAD-RADS 2. Mild non-obstructive CAD (25-49%). Consider
non-atherosclerotic causes of chest pain. Consider preventive
therapy and risk factor modification.

*** End of Addendum ***
EXAM:
OVER-READ INTERPRETATION  CT CHEST

The following report is a limited chest CT over-read performed by
radiologist Dr. Nikunj Rosario [REDACTED] on 07/21/2021.
This over-read does not include interpretation of cardiac or
coronary anatomy or pathology. The coronary CTA interpretation by
the cardiologist is attached.
FINDINGS: Vascular: No pericardial effusion.  Aorta normal caliber.

Mediastinum/Nodes: Esophagus unremarkable.  No observed adenopathy.

Lungs/Pleura: Lungs clear without infiltrate, pleural effusion, or
pneumothorax

Upper Abdomen: Unremarkable

Musculoskeletal: Unremarkable
IMPRESSION: No significant extracardiac findings.
